# Patient Record
Sex: Male | Born: 1955
Health system: Southern US, Community
[De-identification: ages and names within clinical notes are randomized; demographics above are authoritative.]

## PROBLEM LIST (undated history)

## (undated) DIAGNOSIS — I10 Essential (primary) hypertension: Secondary | ICD-10-CM

## (undated) DIAGNOSIS — C4492 Squamous cell carcinoma of skin, unspecified: Secondary | ICD-10-CM

## (undated) HISTORY — DX: Squamous cell carcinoma of skin, unspecified: C44.92

---
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
## 2007-05-20 ENCOUNTER — Ambulatory Visit: Payer: Self-pay | Admitting: Sports Medicine

## 2007-05-20 DIAGNOSIS — G5750 Tarsal tunnel syndrome, unspecified lower limb: Secondary | ICD-10-CM | POA: Insufficient documentation

## 2011-07-18 ENCOUNTER — Other Ambulatory Visit: Payer: Self-pay | Admitting: Neurosurgery

## 2011-07-18 ENCOUNTER — Ambulatory Visit
Admission: RE | Admit: 2011-07-18 | Discharge: 2011-07-18 | Disposition: A | Discharge status: Home / Self Care | Payer: BC Managed Care – PPO | Source: Ambulatory Visit | Attending: Neurosurgery | Admitting: Neurosurgery

## 2011-07-18 DIAGNOSIS — M545 Low back pain, unspecified: Secondary | ICD-10-CM

## 2012-04-04 DIAGNOSIS — L719 Rosacea, unspecified: Secondary | ICD-10-CM | POA: Insufficient documentation

## 2012-04-04 DIAGNOSIS — D485 Neoplasm of uncertain behavior of skin: Secondary | ICD-10-CM | POA: Insufficient documentation

## 2012-04-04 DIAGNOSIS — L570 Actinic keratosis: Secondary | ICD-10-CM | POA: Insufficient documentation

## 2012-04-04 DIAGNOSIS — B079 Viral wart, unspecified: Secondary | ICD-10-CM | POA: Insufficient documentation

## 2012-11-10 DIAGNOSIS — L578 Other skin changes due to chronic exposure to nonionizing radiation: Secondary | ICD-10-CM | POA: Insufficient documentation

## 2014-01-21 ENCOUNTER — Ambulatory Visit (INDEPENDENT_AMBULATORY_CARE_PROVIDER_SITE_OTHER): Payer: BC Managed Care – PPO | Admitting: Podiatry

## 2014-01-21 ENCOUNTER — Ambulatory Visit (INDEPENDENT_AMBULATORY_CARE_PROVIDER_SITE_OTHER): Payer: BC Managed Care – PPO

## 2014-01-21 ENCOUNTER — Encounter: Payer: Self-pay | Admitting: Podiatry

## 2014-01-21 VITALS — BP 143/96 | HR 62 | Resp 16

## 2014-01-21 DIAGNOSIS — M79673 Pain in unspecified foot: Secondary | ICD-10-CM

## 2014-01-21 DIAGNOSIS — M79609 Pain in unspecified limb: Secondary | ICD-10-CM
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
## 2014-07-07 ENCOUNTER — Emergency Department (HOSPITAL_COMMUNITY)
Admission: EM | Admit: 2014-07-07 | Discharge: 2014-07-07 | Disposition: A | Discharge status: Home / Self Care | Payer: BC Managed Care – PPO | Attending: Emergency Medicine | Admitting: Emergency Medicine

## 2014-07-07 ENCOUNTER — Emergency Department (HOSPITAL_COMMUNITY): Payer: BC Managed Care – PPO

## 2014-07-07 ENCOUNTER — Encounter (HOSPITAL_COMMUNITY): Payer: Self-pay | Admitting: Emergency Medicine

## 2014-07-07 DIAGNOSIS — I10 Essential (primary) hypertension: Secondary | ICD-10-CM | POA: Insufficient documentation

## 2014-07-07 DIAGNOSIS — R200 Anesthesia of skin: Secondary | ICD-10-CM | POA: Diagnosis present

## 2014-07-07 DIAGNOSIS — R0789 Other chest pain: Secondary | ICD-10-CM

## 2014-07-07 HISTORY — DX: Essential (primary) hypertension: I10

## 2014-07-07 LAB — BASIC METABOLIC PANEL
ANION GAP: 13 (ref 5–15)
BUN: 18 mg/dL (ref 6–23)
CHLORIDE: 106 meq/L (ref 96–112)
CO2: 23 mEq/L (ref 19–32)
Calcium: 9 mg/dL (ref 8.4–10.5)
Creatinine, Ser: 0.98 mg/dL (ref 0.50–1.35)
GFR calc Af Amer: >90 mL/min (ref >90)
GFR calc non Af Amer: 90 mL/min — ABNORMAL LOW (ref >90)
GLUCOSE: 101 mg/dL — AB (ref 70–99)
POTASSIUM: 3.9 meq/L (ref 3.7–5.3)
Sodium: 142 mEq/L (ref 137–147)

## 2014-07-07 LAB — CBC
HCT: 44.9 % (ref 39.0–52.0)
HEMOGLOBIN: 16 g/dL (ref 13.0–17.0)
MCH: 31.3 pg (ref 26.0–34.0)
MCHC: 35.6 g/dL (ref 30.0–36.0)
MCV: 87.9 fL (ref 78.0–100.0)
Platelets: 168 10*3/uL (ref 150–400)
RBC: 5.11 MIL/uL (ref 4.22–5.81)
RDW: 12.6 % (ref 11.5–15.5)
WBC: 5.3 10*3/uL (ref 4.0–10.5)

## 2014-07-07 LAB — I-STAT TROPONIN, ED: Troponin i, poc: 0 ng/mL (ref 0.00–0.08)

## 2014-07-07 LAB — TROPONIN I: Troponin I: <0.3 ng/mL

## 2014-07-07 MED ORDER — ASPIRIN 81 MG PO CHEW
324.0000 mg | CHEWABLE_TABLET | Freq: Once | ORAL | Status: AC
  Administered 2014-07-07: 324 mg via ORAL
  Filled 2014-07-07: qty 4

## 2014-07-07 NOTE — ED Notes (Addendum)
Pt states he stopped taking his antihypertensive medication 3 months ago because it gave him a discomfort in the middle of his arm.

## 2014-07-07 NOTE — ED Notes (Addendum)
Pt. reports left chest discomfort with left hand numbness onset this evening , no SOB , denies nausea or diaphoresis . Pt. Has not taken his antihypertensive medication for several months.

## 2014-07-07 NOTE — ED Provider Notes (Signed)
CSN: [id number]     Arrival date & time 07/07/14  0355 History   First MD Initiated Contact with Patient 07/07/14 0526     Chief Complaint  Patient presents with  . Numbness     (Consider location/radiation/quality/duration/timing/severity/associated sxs/prior Treatment) Patient is a 58 y.o. male presenting with chest pain.  Chest Pain Pain location:  L chest Pain quality: aching   Radiates to: left arm. Pain severity:  Mild (1.5/10 at worst) Onset quality:  Gradual Duration:  12 hours Timing:  Constant Progression:  Resolved Chronicity:  New Context comment:  Spontaneoys Relieved by:  Nothing Worsened by:  Nothing tried Associated symptoms: no dizziness, no nausea, no shortness of breath and not vomiting   Associated symptoms comment:  Tingling left hand   Past Medical History  Diagnosis Date  . Hypertension    History reviewed. No pertinent past surgical history. No family history on file. History  Substance Use Topics  . Smoking status: Never Smoker   . Smokeless tobacco: Not on file  . Alcohol Use: Yes    Review of Systems  Respiratory: Negative for shortness of breath.   Cardiovascular: Positive for chest pain.  Gastrointestinal: Negative for nausea and vomiting.  Neurological: Negative for dizziness.  All other systems reviewed and are negative.     Allergies  Review of patient's allergies indicates no known allergies.  Home Medications   Prior to Admission medications   Not on File   BP 160/112 mmHg  Pulse 58  Temp(Src) 97.5 F (36.4 C) (Oral)  Resp 21  Ht 6' (1.829 m)  Wt 225 lb (102.059 kg)  BMI 30.51 kg/m2  SpO2 98% Physical Exam  Constitutional: He is oriented to person, place, and time. He appears well-developed and well-nourished. No distress.  HENT:  Head: Normocephalic and atraumatic.  Mouth/Throat: Oropharynx is clear and moist.  Eyes: Conjunctivae are normal. Pupils are equal, round, and reactive to light. No scleral icterus.   Neck: Neck supple.  Cardiovascular: Normal rate, regular rhythm, normal heart sounds and intact distal pulses.   No murmur heard. Pulmonary/Chest: Effort normal and breath sounds normal. No stridor. No respiratory distress. He has no wheezes. He has no rales.  Abdominal: Soft. He exhibits no distension. There is no tenderness.  Musculoskeletal: Normal range of motion. He exhibits no edema.  Neurological: He is alert and oriented to person, place, and time. He has normal strength. No cranial nerve deficit or sensory deficit. Coordination normal.  Skin: Skin is warm and dry. No rash noted.  Psychiatric: He has a normal mood and affect. His behavior is normal.  Nursing note and vitals reviewed.   ED Course  Procedures (including critical care time) Labs Review Labs Reviewed  BASIC METABOLIC PANEL - Abnormal; Notable for the following:    Glucose, Bld 101 (*)    GFR calc non Af Amer 90 (*)    All other components within normal limits  CBC  TROPONIN I  Randolm Idol, ED    Imaging Review Dg Chest 2 View  07/07/2014   CLINICAL DATA:  LEFT-sided chest discomfort for 1-1/2 disease.  EXAM: CHEST  2 VIEW  COMPARISON:  None.  FINDINGS: Cardiomediastinal silhouette is unremarkable. The lungs are clear without pleural effusions or focal consolidations. Trachea projects midline and there is no pneumothorax. Soft tissue planes and included osseous structures are non-suspicious.  IMPRESSION: No active cardiopulmonary disease.   Electronically Signed   By: Elon Alas   On: 07/07/2014 04:32  All radiology  studies independently viewed by me.      EKG Interpretation   Date/Time:  Wednesday July 07 2014 04:04:48 EST Ventricular Rate:  67 PR Interval:  138 QRS Duration: 96 QT Interval:  408 QTC Calculation: 431 R Axis:   77 Text Interpretation:  Normal sinus rhythm Normal ECG No old tracing to  compare Confirmed by [facility]  MD, TREY ([id number]) on 07/07/2014 5:26:24 AM      MDM    Final diagnoses:  Chest discomfort    58 year old male with history of hypertension presenting with atypical chest pain. Well-appearing and pain-free on exam.  His blood pressure is elevated, but likely because he stopped taking his BP meds.  He has them with him and will begin taking them again.  By his history, I doubt his chest pain is ACS, dissection, or PE.  Delta troponin negative.  I think he can be discharged and follow up outpatient.  Urged to take his BP meds.      Artis Delay, MD 07/07/14 [phone]

## 2014-07-07 NOTE — ED Notes (Signed)
Pt. Self administered home dose of atenolol-Chlorthal 100-25 mg @ 0715 per Dr. Doy Mince order.

## 2014-07-07 NOTE — Discharge Instructions (Signed)

## 2014-07-26 ENCOUNTER — Encounter (HOSPITAL_COMMUNITY): Payer: BC Managed Care – PPO

## 2014-10-21 DIAGNOSIS — D1801 Hemangioma of skin and subcutaneous tissue: Secondary | ICD-10-CM | POA: Insufficient documentation

## 2014-10-21 DIAGNOSIS — B351 Tinea unguium: Secondary | ICD-10-CM | POA: Insufficient documentation

## 2014-12-08 ENCOUNTER — Telehealth (HOSPITAL_COMMUNITY): Payer: Self-pay | Admitting: *Deleted

## 2014-12-08 ENCOUNTER — Encounter (HOSPITAL_COMMUNITY): Payer: Self-pay

## 2014-12-08 NOTE — Telephone Encounter (Signed)
Close encounter 

## 2014-12-09 ENCOUNTER — Ambulatory Visit (HOSPITAL_COMMUNITY): Payer: BLUE CROSS/BLUE SHIELD | Attending: Internal Medicine | Admitting: Radiology

## 2014-12-09 VITALS — BP 125/91 | Ht 71.5 in | Wt 228.0 lb

## 2014-12-09 DIAGNOSIS — I10 Essential (primary) hypertension: Secondary | ICD-10-CM | POA: Diagnosis not present

## 2014-12-09 DIAGNOSIS — R079 Chest pain, unspecified: Secondary | ICD-10-CM

## 2014-12-09 MED ORDER — TECHNETIUM TC 99M SESTAMIBI GENERIC - CARDIOLITE
11.0000 | Freq: Once | INTRAVENOUS | Status: AC | PRN
  Administered 2014-12-09: 11 via INTRAVENOUS

## 2014-12-09 MED ORDER — TECHNETIUM TC 99M SESTAMIBI GENERIC - CARDIOLITE
33.0000 | Freq: Once | INTRAVENOUS | Status: AC | PRN
  Administered 2014-12-09: 33 via INTRAVENOUS

## 2014-12-09 NOTE — Progress Notes (Signed)
[facility] [place], [place] [address] [phone]    Cardiology Nuclear Med Study  Brian Sherman is a 59 y.o. male     MRN : [id number]     DOB: September 14, 1955  Procedure Date: 12/09/2014  Nuclear Med Background Indication for Stress Test:  Evaluation for Ischemia History:  MPI: 10 yrs ago Cardiac Risk Factors: Hypertension  Symptoms:  Chest Pain   Nuclear Pre-Procedure Caffeine/Decaff Intake:  None> 12 hrs NPO After: 9:00pm   Lungs:  clear O2 Sat: 97% on room air. IV 0.9% NS with Angio Cath:  22g  IV Site: R Antecubital x 1, tolerated well IV Started by:  Irven Baltimore, RN  Chest Size (in):  42 Cup Size: n/a  Height: 5' 11.5" (1.816 m)  Weight:  228 lb (103.42 kg)  BMI:  Body mass index is 31.36 kg/(m^2). Tech Comments:  Patient held Atenolol-chlorthalidome x 24 hrs. Irven Baltimore, RN    Nuclear Med Study 1 or 2 day study: 1 day  Stress Test Type:  Stress  Reading MD: N/A  Order Authorizing Provider:  Gaynelle Arabian, MD  Resting Radionuclide: Technetium 78m Sestamibi  Resting Radionuclide Dose: 11.0 mCi   Stress Radionuclide:  Technetium 82m Sestamibi  Stress Radionuclide Dose: 33.0 mCi           Stress Protocol Rest HR: 48 Stress HR: 151  Rest BP: 125/91 Stress BP: 212/89  Exercise Time (min): 10:15 METS: 12.10   Predicted Max HR: 163 bpm % Max HR: 92.64 bpm Rate Pressure Product: 32012   Dose of Adenosine (mg):  n/a Dose of Lexiscan: n/a mg  Dose of Atropine (mg): n/a Dose of Dobutamine: n/a mcg/kg/min (at max HR)  Stress Test Technologist: Perrin Maltese, EMT-P  Nuclear Technologist:  Earl Many, CNMT     Rest Procedure:  Myocardial perfusion imaging was performed at rest 45 minutes following the intravenous administration of Technetium 37m Sestamibi. Rest ECG: Sinus bradycardia  Stress Procedure:  The patient exercised on the treadmill utilizing the Bruce Protocol for 10:15 minutes. The patient stopped due to  doe, fatigue and denied any chest pain.  Technetium 88m Sestamibi was injected at peak exercise and myocardial perfusion imaging was performed after a brief delay. Stress ECG: There was 30mm of upsloping ST segment depression at peak exercise.    QPS Raw Data Images:  Mild diaphragmatic attenuation.  Normal left ventricular size. Stress Images:  Normal homogeneous uptake in all areas of the myocardium. Rest Images:  Normal homogeneous uptake in all areas of the myocardium. Subtraction (SDS):  No evidence of ischemia. Transient Ischemic Dilatation (Normal <1.22):  0.84 Lung/Heart Ratio (Normal <0.45):  0.38  Quantitative Gated Spect Images QGS EDV:  106 ml QGS ESV:  46 ml  Impression Exercise Capacity:  Excellent exercise capacity. BP Response:  Hypertensive blood pressure response. Clinical Symptoms:  There is dyspnea. ECG Impression:  Insignificant upsloping ST segment depression. Comparison with Prior Nuclear Study: No images to compare  Overall Impression:  Normal stress nuclear study.  LV Ejection Fraction: 57%.  LV Wall Motion:  NL LV Function; NL Wall Motion   Signed: Fransico Him, MD [facility] 12/09/2014

## 2015-01-18 ENCOUNTER — Other Ambulatory Visit: Payer: Self-pay | Admitting: Surgery

## 2016-04-19 DIAGNOSIS — D485 Neoplasm of uncertain behavior of skin: Secondary | ICD-10-CM | POA: Diagnosis not present

## 2016-04-19 DIAGNOSIS — D229 Melanocytic nevi, unspecified: Secondary | ICD-10-CM | POA: Diagnosis not present

## 2016-04-19 DIAGNOSIS — L821 Other seborrheic keratosis: Secondary | ICD-10-CM | POA: Diagnosis not present

## 2016-04-19 DIAGNOSIS — Z808 Family history of malignant neoplasm of other organs or systems: Secondary | ICD-10-CM | POA: Diagnosis not present

## 2016-04-19 DIAGNOSIS — C44519 Basal cell carcinoma of skin of other part of trunk: Secondary | ICD-10-CM | POA: Diagnosis not present

## 2016-04-19 DIAGNOSIS — L570 Actinic keratosis: Secondary | ICD-10-CM | POA: Diagnosis not present

## 2016-06-01 IMAGING — NM NM MYOCAR MULTI W/ SPECT
3 series · 18 of 18 positions shown · non-contrast
Comparison: none

[Series 1: stress_(id)_sa · 6.5mm · 6.51mm/px · 6 of 64 frames shown (1 of 2)]
[frame 6/64]
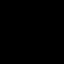
[frame 16/64]
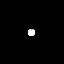
[frame 27/64]
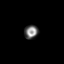
[frame 38/64]
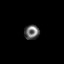
[frame 48/64]
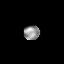
[frame 59/64]
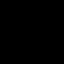

[Series 1: stress_(id)_sa · 6.5mm · 6.51mm/px · 6 of 512 frames shown (2 of 2)]
[frame 43/512]
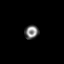
[frame 128/512]
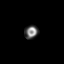
[frame 214/512]
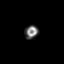
[frame 299/512]
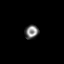
[frame 384/512]
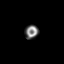
[frame 470/512]
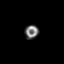

[Series 1: rest_(id)_sa · 6.5mm · 6.51mm/px · 6 of 64 frames shown]
[frame 6/64]
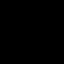
[frame 16/64]
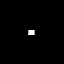
[frame 27/64]
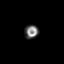
[frame 38/64]
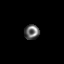
[frame 48/64]
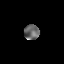
[frame 59/64]
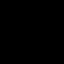

[18 of 18 positions shown; findings below may reference images not displayed]

Canned report from images found in remote index.

Refer to host system for actual result text.

## 2016-06-08 DIAGNOSIS — C44519 Basal cell carcinoma of skin of other part of trunk: Secondary | ICD-10-CM | POA: Diagnosis not present

## 2016-07-15 DIAGNOSIS — Z23 Encounter for immunization: Secondary | ICD-10-CM | POA: Diagnosis not present

## 2016-10-05 DIAGNOSIS — Z125 Encounter for screening for malignant neoplasm of prostate: Secondary | ICD-10-CM | POA: Diagnosis not present

## 2016-10-05 DIAGNOSIS — Z0000 Encounter for general adult medical examination without abnormal findings: Secondary | ICD-10-CM | POA: Diagnosis not present

## 2016-10-05 DIAGNOSIS — I10 Essential (primary) hypertension: Secondary | ICD-10-CM | POA: Diagnosis not present

## 2016-11-05 DIAGNOSIS — Z85828 Personal history of other malignant neoplasm of skin: Secondary | ICD-10-CM | POA: Diagnosis not present

## 2016-11-05 DIAGNOSIS — L814 Other melanin hyperpigmentation: Secondary | ICD-10-CM | POA: Diagnosis not present

## 2016-11-05 DIAGNOSIS — L821 Other seborrheic keratosis: Secondary | ICD-10-CM | POA: Diagnosis not present

## 2016-11-05 DIAGNOSIS — I781 Nevus, non-neoplastic: Secondary | ICD-10-CM | POA: Diagnosis not present

## 2016-11-05 DIAGNOSIS — L570 Actinic keratosis: Secondary | ICD-10-CM | POA: Diagnosis not present

## 2017-03-12 DIAGNOSIS — H40013 Open angle with borderline findings, low risk, bilateral: Secondary | ICD-10-CM | POA: Diagnosis not present

## 2017-03-12 DIAGNOSIS — H52203 Unspecified astigmatism, bilateral: Secondary | ICD-10-CM | POA: Diagnosis not present

## 2017-03-12 DIAGNOSIS — H524 Presbyopia: Secondary | ICD-10-CM | POA: Diagnosis not present

## 2017-04-02 DIAGNOSIS — T1512XA Foreign body in conjunctival sac, left eye, initial encounter: Secondary | ICD-10-CM | POA: Diagnosis not present

## 2017-05-15 DIAGNOSIS — L821 Other seborrheic keratosis: Secondary | ICD-10-CM | POA: Diagnosis not present

## 2017-05-15 DIAGNOSIS — L570 Actinic keratosis: Secondary | ICD-10-CM | POA: Diagnosis not present

## 2017-05-15 DIAGNOSIS — Z85828 Personal history of other malignant neoplasm of skin: Secondary | ICD-10-CM | POA: Diagnosis not present

## 2017-05-15 DIAGNOSIS — D1801 Hemangioma of skin and subcutaneous tissue: Secondary | ICD-10-CM | POA: Diagnosis not present

## 2017-05-15 DIAGNOSIS — L814 Other melanin hyperpigmentation: Secondary | ICD-10-CM | POA: Diagnosis not present

## 2017-05-15 DIAGNOSIS — L820 Inflamed seborrheic keratosis: Secondary | ICD-10-CM | POA: Diagnosis not present

## 2017-09-06 ENCOUNTER — Other Ambulatory Visit: Payer: Self-pay | Admitting: *Deleted

## 2017-09-06 DIAGNOSIS — M109 Gout, unspecified: Secondary | ICD-10-CM

## 2017-09-06 MED ORDER — CEPHALEXIN 500 MG PO CAPS: 500.0000 mg | ORAL_CAPSULE | Freq: Three times a day (TID) | ORAL | 0 refills | Status: DC

## 2017-10-09 DIAGNOSIS — Z0000 Encounter for general adult medical examination without abnormal findings: Secondary | ICD-10-CM | POA: Diagnosis not present

## 2017-10-09 DIAGNOSIS — I10 Essential (primary) hypertension: Secondary | ICD-10-CM | POA: Diagnosis not present

## 2018-04-07 DIAGNOSIS — M25562 Pain in left knee: Secondary | ICD-10-CM | POA: Diagnosis not present

## 2018-06-10 DIAGNOSIS — L570 Actinic keratosis: Secondary | ICD-10-CM | POA: Diagnosis not present

## 2018-06-10 DIAGNOSIS — L821 Other seborrheic keratosis: Secondary | ICD-10-CM | POA: Diagnosis not present

## 2018-06-10 DIAGNOSIS — L578 Other skin changes due to chronic exposure to nonionizing radiation: Secondary | ICD-10-CM | POA: Diagnosis not present

## 2018-06-10 DIAGNOSIS — Z85828 Personal history of other malignant neoplasm of skin: Secondary | ICD-10-CM | POA: Diagnosis not present

## 2018-06-10 DIAGNOSIS — D1801 Hemangioma of skin and subcutaneous tissue: Secondary | ICD-10-CM | POA: Diagnosis not present

## 2018-09-01 ENCOUNTER — Other Ambulatory Visit: Payer: Self-pay

## 2018-09-01 ENCOUNTER — Other Ambulatory Visit: Payer: Self-pay | Admitting: Podiatry

## 2018-09-01 DIAGNOSIS — M109 Gout, unspecified: Secondary | ICD-10-CM | POA: Diagnosis not present

## 2018-09-01 MED ORDER — PREDNISONE 10 MG (48) PO TBPK: ORAL_TABLET | Freq: Every day | ORAL | 0 refills | Status: DC

## 2018-09-01 NOTE — Addendum Note (Signed)
Addended by: Celene Skeen A on: 09/01/2018 08:50 AM   Modules accepted: Orders

## 2018-09-03 LAB — ANTI-NUCLEAR AB-TITER (ANA TITER)

## 2018-09-03 LAB — URIC ACID: URIC ACID, SERUM: 7.1 mg/dL (ref 4.0–8.0)

## 2018-09-03 LAB — HLA-B27 ANTIGEN: HLA-B27 Antigen: POSITIVE — AB

## 2018-09-03 LAB — C-REACTIVE PROTEIN: CRP: 6.1 mg/L (ref <8.0)

## 2018-09-03 LAB — ANA: Anti Nuclear Antibody (ANA): POSITIVE — AB

## 2018-09-03 LAB — SEDIMENTATION RATE: Sed Rate: 6 mm/h (ref 0–20)

## 2018-09-03 LAB — RHEUMATOID FACTOR: Rhuematoid fact SerPl-aCnc: <14 IU/mL (ref <14)

## 2018-09-05 ENCOUNTER — Encounter: Payer: Self-pay | Admitting: Podiatry

## 2019-03-16 DIAGNOSIS — H40023 Open angle with borderline findings, high risk, bilateral: Secondary | ICD-10-CM | POA: Diagnosis not present

## 2019-03-16 DIAGNOSIS — H2513 Age-related nuclear cataract, bilateral: Secondary | ICD-10-CM | POA: Diagnosis not present

## 2019-03-16 DIAGNOSIS — H52203 Unspecified astigmatism, bilateral: Secondary | ICD-10-CM | POA: Diagnosis not present

## 2019-03-27 DIAGNOSIS — H1031 Unspecified acute conjunctivitis, right eye: Secondary | ICD-10-CM | POA: Diagnosis not present

## 2019-04-03 DIAGNOSIS — H1031 Unspecified acute conjunctivitis, right eye: Secondary | ICD-10-CM | POA: Diagnosis not present

## 2019-06-01 ENCOUNTER — Other Ambulatory Visit: Payer: Self-pay

## 2019-06-01 DIAGNOSIS — Z20822 Contact with and (suspected) exposure to covid-19: Secondary | ICD-10-CM

## 2019-06-03 LAB — NOVEL CORONAVIRUS, NAA: SARS-CoV-2, NAA: NOT DETECTED

## 2019-07-20 DIAGNOSIS — D1039 Benign neoplasm of other parts of mouth: Secondary | ICD-10-CM | POA: Diagnosis not present

## 2019-08-14 HISTORY — PX: CATARACT EXTRACTION: SUR2

## 2019-08-17 DIAGNOSIS — D1039 Benign neoplasm of other parts of mouth: Secondary | ICD-10-CM | POA: Diagnosis not present

## 2019-08-21 ENCOUNTER — Ambulatory Visit: Payer: BLUE CROSS/BLUE SHIELD | Attending: Internal Medicine

## 2019-08-21 DIAGNOSIS — Z20822 Contact with and (suspected) exposure to covid-19: Secondary | ICD-10-CM

## 2019-08-23 LAB — NOVEL CORONAVIRUS, NAA: SARS-CoV-2, NAA: NOT DETECTED

## 2019-09-03 DIAGNOSIS — L821 Other seborrheic keratosis: Secondary | ICD-10-CM | POA: Diagnosis not present

## 2019-09-03 DIAGNOSIS — L812 Freckles: Secondary | ICD-10-CM | POA: Diagnosis not present

## 2019-09-03 DIAGNOSIS — D1801 Hemangioma of skin and subcutaneous tissue: Secondary | ICD-10-CM | POA: Diagnosis not present

## 2019-09-03 DIAGNOSIS — D045 Carcinoma in situ of skin of trunk: Secondary | ICD-10-CM | POA: Diagnosis not present

## 2019-09-03 DIAGNOSIS — L570 Actinic keratosis: Secondary | ICD-10-CM | POA: Diagnosis not present

## 2019-09-03 DIAGNOSIS — Z85828 Personal history of other malignant neoplasm of skin: Secondary | ICD-10-CM | POA: Diagnosis not present

## 2019-09-16 DIAGNOSIS — H40013 Open angle with borderline findings, low risk, bilateral: Secondary | ICD-10-CM | POA: Diagnosis not present

## 2019-09-16 DIAGNOSIS — H2513 Age-related nuclear cataract, bilateral: Secondary | ICD-10-CM | POA: Diagnosis not present

## 2019-09-24 DIAGNOSIS — H2512 Age-related nuclear cataract, left eye: Secondary | ICD-10-CM | POA: Diagnosis not present

## 2019-09-24 DIAGNOSIS — H25812 Combined forms of age-related cataract, left eye: Secondary | ICD-10-CM | POA: Diagnosis not present

## 2019-09-25 ENCOUNTER — Ambulatory Visit: Payer: BLUE CROSS/BLUE SHIELD | Attending: Internal Medicine

## 2019-09-25 DIAGNOSIS — Z20822 Contact with and (suspected) exposure to covid-19: Secondary | ICD-10-CM | POA: Diagnosis not present

## 2019-09-27 LAB — NOVEL CORONAVIRUS, NAA: SARS-CoV-2, NAA: NOT DETECTED

## 2019-10-03 DIAGNOSIS — Z20828 Contact with and (suspected) exposure to other viral communicable diseases: Secondary | ICD-10-CM | POA: Diagnosis not present

## 2019-10-05 ENCOUNTER — Ambulatory Visit: Payer: BLUE CROSS/BLUE SHIELD

## 2019-10-09 ENCOUNTER — Emergency Department (HOSPITAL_COMMUNITY): Payer: BLUE CROSS/BLUE SHIELD

## 2019-10-09 ENCOUNTER — Encounter (HOSPITAL_COMMUNITY): Payer: Self-pay | Admitting: Emergency Medicine

## 2019-10-09 ENCOUNTER — Emergency Department (HOSPITAL_COMMUNITY)
Admission: EM | Admit: 2019-10-09 | Discharge: 2019-10-09 | Disposition: A | Discharge status: Home / Self Care | Payer: BLUE CROSS/BLUE SHIELD | Attending: Emergency Medicine | Admitting: Emergency Medicine

## 2019-10-09 ENCOUNTER — Other Ambulatory Visit: Payer: Self-pay

## 2019-10-09 DIAGNOSIS — R001 Bradycardia, unspecified: Secondary | ICD-10-CM | POA: Diagnosis not present

## 2019-10-09 DIAGNOSIS — R519 Headache, unspecified: Secondary | ICD-10-CM | POA: Diagnosis not present

## 2019-10-09 DIAGNOSIS — R202 Paresthesia of skin: Secondary | ICD-10-CM | POA: Diagnosis not present

## 2019-10-09 DIAGNOSIS — Z7982 Long term (current) use of aspirin: Secondary | ICD-10-CM | POA: Diagnosis not present

## 2019-10-09 DIAGNOSIS — R200 Anesthesia of skin: Secondary | ICD-10-CM | POA: Insufficient documentation

## 2019-10-09 DIAGNOSIS — I10 Essential (primary) hypertension: Secondary | ICD-10-CM | POA: Insufficient documentation

## 2019-10-09 DIAGNOSIS — G459 Transient cerebral ischemic attack, unspecified: Secondary | ICD-10-CM | POA: Diagnosis not present

## 2019-10-09 LAB — CBC
HCT: 44.6 % (ref 39.0–52.0)
Hemoglobin: 15.1 g/dL (ref 13.0–17.0)
MCH: 30.5 pg (ref 26.0–34.0)
MCHC: 33.9 g/dL (ref 30.0–36.0)
MCV: 90.1 fL (ref 80.0–100.0)
Platelets: 178 10*3/uL (ref 150–400)
RBC: 4.95 MIL/uL (ref 4.22–5.81)
RDW: 12 % (ref 11.5–15.5)
WBC: 5.5 10*3/uL (ref 4.0–10.5)
nRBC: 0 % (ref 0.0–0.2)

## 2019-10-09 LAB — I-STAT CHEM 8, ED
BUN: 22 mg/dL (ref 8–23)
Calcium, Ion: 1.13 mmol/L — ABNORMAL LOW (ref 1.15–1.40)
Chloride: 102 mmol/L (ref 98–111)
Creatinine, Ser: 0.9 mg/dL (ref 0.61–1.24)
Glucose, Bld: 138 mg/dL — ABNORMAL HIGH (ref 70–99)
HCT: 43 % (ref 39.0–52.0)
Hemoglobin: 14.6 g/dL (ref 13.0–17.0)
Potassium: 3.1 mmol/L — ABNORMAL LOW (ref 3.5–5.1)
Sodium: 139 mmol/L (ref 135–145)
TCO2: 27 mmol/L (ref 22–32)

## 2019-10-09 LAB — COMPREHENSIVE METABOLIC PANEL
ALT: 27 U/L (ref 0–44)
AST: 30 U/L (ref 15–41)
Albumin: 3.9 g/dL (ref 3.5–5.0)
Alkaline Phosphatase: 45 U/L (ref 38–126)
Anion gap: 11 (ref 5–15)
BUN: 21 mg/dL (ref 8–23)
CO2: 25 mmol/L (ref 22–32)
Calcium: 8.9 mg/dL (ref 8.9–10.3)
Chloride: 103 mmol/L (ref 98–111)
Creatinine, Ser: 0.99 mg/dL (ref 0.61–1.24)
GFR calc Af Amer: >60 mL/min (ref >60)
GFR calc non Af Amer: >60 mL/min (ref >60)
Glucose, Bld: 147 mg/dL — ABNORMAL HIGH (ref 70–99)
Potassium: 3.2 mmol/L — ABNORMAL LOW (ref 3.5–5.1)
Sodium: 139 mmol/L (ref 135–145)
Total Bilirubin: 0.7 mg/dL (ref 0.3–1.2)
Total Protein: 6.4 g/dL — ABNORMAL LOW (ref 6.5–8.1)

## 2019-10-09 LAB — DIFFERENTIAL
Abs Immature Granulocytes: 0.02 10*3/uL (ref 0.00–0.07)
Basophils Absolute: 0 10*3/uL (ref 0.0–0.1)
Basophils Relative: 1 %
Eosinophils Absolute: 0.1 10*3/uL (ref 0.0–0.5)
Eosinophils Relative: 2 %
Immature Granulocytes: 0 %
Lymphocytes Relative: 23 %
Lymphs Abs: 1.3 10*3/uL (ref 0.7–4.0)
Monocytes Absolute: 0.3 10*3/uL (ref 0.1–1.0)
Monocytes Relative: 6 %
Neutro Abs: 3.7 10*3/uL (ref 1.7–7.7)
Neutrophils Relative %: 68 %

## 2019-10-09 LAB — APTT: aPTT: 32 seconds (ref 24–36)

## 2019-10-09 LAB — PROTIME-INR
INR: 1 (ref 0.8–1.2)
Prothrombin Time: 13.4 seconds (ref 11.4–15.2)

## 2019-10-09 MED ORDER — ASPIRIN 81 MG PO CHEW
81.0000 mg | CHEWABLE_TABLET | Freq: Once | ORAL | Status: AC
  Administered 2019-10-09: 81 mg via ORAL
  Filled 2019-10-09: qty 1

## 2019-10-09 MED ORDER — ASPIRIN 81 MG PO CHEW: 81.0000 mg | CHEWABLE_TABLET | Freq: Every day | ORAL | 1 refills | Status: AC

## 2019-10-09 MED ORDER — POTASSIUM CHLORIDE CRYS ER 20 MEQ PO TBCR
40.0000 meq | EXTENDED_RELEASE_TABLET | Freq: Once | ORAL | Status: AC
  Administered 2019-10-09: 40 meq via ORAL
  Filled 2019-10-09: qty 2

## 2019-10-09 MED ORDER — SODIUM CHLORIDE 0.9% FLUSH: 3.0000 mL | Freq: Once | INTRAVENOUS | Status: DC

## 2019-10-09 NOTE — ED Provider Notes (Signed)
[facility] EM[facility] DEPARTMENT Provider Note   CSN: [id number] Arrival date & time: 10/09/19  1230     History Chief Complaint  Patient presents with  . left arm numbness  . facial numbness    Brian Sherman is a 64 y.o. male w/ HTN presenting to ED with left hand numbness.  He reports onset of symptoms around 10 am.  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  He began having numbness/paresthesias in his left forearm from the elbow into his hand, including the entire hand.  He thought it was positional.  However when he walked out out of the workplace he felt the right half of his face going numb.  He felt his face was "flushed."  That sensation has resolved but his left hand feels persistently numb.  Denies headache, LOC, facial droop, difficulty speaking, imbalance, falls. Denies hx of TIA or stroke.  HPI     Past Medical History:  Diagnosis Date  . Hypertension     Patient Active Problem List   Diagnosis Date Noted  . TARSAL TUNNEL SYNDROME, LEFT 05/20/2007    Past Surgical History:  Procedure Laterality Date  . CATARACT EXTRACTION         No family history on file.  Social History   Tobacco Use  . Smoking status: Never Smoker  Substance Use Topics  . Alcohol use: Yes  . Drug use: No    Home Medications Prior to Admission medications   Medication Sig Start Date End Date Taking? Authorizing Provider  atenolol-chlorthalidone (TENORETIC) 100-25 MG per tablet Take 1 tablet by mouth daily.   Yes [provider]  naproxen sodium (ALEVE) 220 MG tablet Take 220-440 mg by mouth 2 (two) times daily as needed (for muscle soreness or pain).    Yes [provider]  prednisoLONE acetate (PRED FORTE) 1 % ophthalmic suspension Place 1 drop into the left eye See admin instructions. Place 1 drop into the left eye three times a day and taper down as directed 09/23/19  Yes [provider]  aspirin 81 MG chewable tablet Chew 1 tablet (81 mg total) by mouth daily. 10/09/19 10/03/20  Wyvonnia Dusky, MD  cephALEXin (KEFLEX) 500 MG capsule Take 1 capsule (500 mg total) by mouth 3 (three) times daily. Patient not taking: Reported on 10/09/2019 09/06/17   Wallene Huh, DPM  predniSONE (STERAPRED UNI-PAK 48 TAB) 10 MG (48) TBPK tablet Take by mouth daily. [place] Patient not taking: Reported on 10/09/2019 09/01/18   Wallene Huh, DPM    Allergies    Patient has no known allergies.  Review of Systems   Review of Systems  Constitutional: Negative for chills and fever.  HENT: Negative for ear pain.   Eyes: Negative for pain and visual disturbance.  Respiratory: Negative for cough and shortness of breath.   Cardiovascular: Negative for chest pain and palpitations.  Gastrointestinal: Negative for abdominal pain and vomiting.  Musculoskeletal: Negative for arthralgias and neck pain.  Neurological: Positive for facial asymmetry and numbness. Negative for dizziness, tremors, seizures, syncope, speech difficulty, weakness and headaches.  Psychiatric/Behavioral: Negative for agitation and confusion.  All other systems reviewed and are negative.   Physical Exam Updated Vital Signs BP 120/71 (BP Location: Right Arm)   Pulse (!) 51   Temp 98.2 F (36.8 C) (Oral)   Resp 18   Ht 6' (1.829 m)  Wt 95.3 kg   SpO2 96%   BMI 28.48 kg/m   Physical Exam Vitals and nursing note reviewed.  Constitutional:      Appearance: He is well-developed.  HENT:     Head: Normocephalic and atraumatic.  Eyes:     Conjunctiva/sclera: Conjunctivae normal.  Cardiovascular:     Rate and Rhythm: Normal rate and regular rhythm.     Heart sounds: No murmur.  Pulmonary:     Effort: Pulmonary effort is normal. No respiratory distress.     Breath sounds: Normal breath sounds.  Abdominal:     Palpations: Abdomen is soft.     Tenderness: There is no abdominal tenderness.    Musculoskeletal:     Cervical back: Neck supple.  Skin:    General: Skin is warm and dry.  Neurological:     Mental Status: He is alert.     GCS: GCS eye subscore is 4. GCS verbal subscore is 5. GCS motor subscore is 6.     Cranial Nerves: Cranial nerves are intact. No cranial nerve deficit, dysarthria or facial asymmetry.     Sensory: Sensation is intact. No sensory deficit.     Motor: Motor function is intact. No weakness, tremor or pronator drift.     Coordination: Coordination is intact. Finger-Nose-Finger Test normal.     Gait: Gait is intact.  Psychiatric:        Mood and Affect: Mood normal.        Behavior: Behavior normal.     ED Results / Procedures / Treatments   Labs (all labs ordered are listed, but only abnormal results are displayed) Labs Reviewed  COMPREHENSIVE METABOLIC PANEL - Abnormal; Notable for the following components:      Result Value   Potassium 3.2 (*)    Glucose, Bld 147 (*)    Total Protein 6.4 (*)    All other components within normal limits  I-STAT CHEM 8, ED - Abnormal; Notable for the following components:   Potassium 3.1 (*)    Glucose, Bld 138 (*)    Calcium, Ion 1.13 (*)    All other components within normal limits  PROTIME-INR  APTT  CBC  DIFFERENTIAL  CBG MONITORING, ED    EKG EKG Interpretation  Date/Time:  Friday October 09 2019 12:34:16 EST Ventricular Rate:  51 PR Interval:  148 QRS Duration: 96 QT Interval:  460 QTC Calculation: 423 R Axis:   76 Text Interpretation: Sinus bradycardia Otherwise normal ECG No STEMI Confirmed by Octaviano Glow [phone]) on 10/09/2019 5:18:37 PM   Radiology CT HEAD WO CONTRAST  Result Date: 10/09/2019 CLINICAL DATA:  Headache, left-sided numbness EXAM: CT HEAD WITHOUT CONTRAST TECHNIQUE: Contiguous axial images were obtained from the base of the skull through the vertex without intravenous contrast. COMPARISON:  None. FINDINGS: Brain: No evidence of acute infarction, hemorrhage,  hydrocephalus, extra-axial collection or mass lesion/mass effect. Vascular: No hyperdense vessel or unexpected calcification. Skull: Normal. Negative for fracture or focal lesion. Sinuses/Orbits: No acute finding. Other: None. IMPRESSION: No acute intracranial findings. Electronically Signed   By: Davina Poke D.O.   On: 10/09/2019 13:45   MR BRAIN WO CONTRAST  Result Date: 10/09/2019 CLINICAL DATA:  TIA, left arm numbness and right face numbness; transient ischemic attack. EXAM: MRI HEAD WITHOUT CONTRAST TECHNIQUE: Multiplanar, multiecho pulse sequences of the brain and surrounding structures were obtained without intravenous contrast. COMPARISON:  Noncontrast head CT 10/09/2019 FINDINGS: Brain: There is no evidence of acute infarct. No evidence of intracranial  mass. No midline shift or extra-axial fluid collection. No chronic intracranial blood products. No significant white matter disease for age. Mild generalized parenchymal atrophy. Partially empty sella turcica. Vascular: Flow voids maintained within the proximal large arterial vessels. Skull and upper cervical spine: No focal marrow lesion. Sinuses/Orbits: Visualized orbits demonstrate no acute abnormality. Minimal ethmoid sinus mucosal thickening. Moderate-sized right maxillary sinus mucous retention cyst. No significant mastoid effusion. IMPRESSION: No evidence of acute intracranial abnormality, including acute infarction. Mild generalized parenchymal atrophy. Mild ethmoid sinus mucosal thickening. Moderate-sized right maxillary sinus mucous retention cyst. Electronically Signed   By: Kellie Simmering DO   On: 10/09/2019 20:55    Procedures Procedures (including critical care time)  Medications Ordered in ED Medications  sodium chloride flush (NS) 0.9 % injection 3 mL (3 mLs Intravenous Not Given 10/09/19 1809)  aspirin chewable tablet 81 mg (81 mg Oral Given 10/09/19 1808)  potassium chloride SA (KLOR-CON) CR tablet 40 mEq (40 mEq Oral Given  10/09/19 1854)    ED Course  I have reviewed the triage vital signs and the nursing notes.  Pertinent labs & imaging results that were available during my care of the patient were reviewed by me and considered in my medical decision making (see chart for details).  64 yo male here with left arm paresthesia and right face tingling that occurred earlier today.  No objective motor deficits or weakness on exam.  No neurological deficits, including fine touch, preserved everywhere.  No facial asymmetry.  CTH negative.  I proceeded to MRI brain for TIA evaluation.  Negative for acute findings as well.  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  Low ABCD2 score.  After discussion with Dr Rory Percy of neurology, I'll start on aspirin 81 mg daily and have him f/u in the neuro clinic   Clinical Course as of Oct 09 10  Fri Oct 09, 2019  2115  IMPRESSION: No evidence of acute intracranial abnormality, including acute infarction.  Mild generalized parenchymal atrophy.  Mild ethmoid sinus mucosal thickening. Moderate-sized right maxillary sinus mucous retention cyst.   [MT]    Clinical Course User Index [MT] Luismiguel Lamere, Carola Rhine, MD    Final Clinical Impression(s) / ED Diagnoses Final diagnoses:  Left arm numbness    Rx / DC Orders ED Discharge Orders         Ordered    Ambulatory referral to Neurology    Comments: An appointment is requested in approximately: 1 week Consulted with Dr Rory Percy in the ED on 2/26, had TIA workup including MRI, advised to start on aspirin and follow up in office   10/09/19 2138    aspirin 81 MG chewable tablet  Daily     10/09/19 2140           Wyvonnia Dusky, MD 10/10/19 [phone]

## 2019-10-09 NOTE — ED Notes (Signed)
Pt transported to MRI 

## 2019-10-09 NOTE — ED Notes (Signed)
Provider aware of pts bradycardia. Pt is asymptomatic

## 2019-10-09 NOTE — Discharge Instructions (Signed)
Your MRI and CT scan did not show signs of stroke or mini-stroke (TIA).  Our neurologist still recommended that you start taking 81 mg aspirin daily and follow up in their office this week.  You may need further workup to rule out vascular disease in the future.  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  If you notice new numbness, weakness, sudden headache, difficulty speaking, sudden lightheadedness, chest pain, or difficulty breathing, come back to the ER immediately.

## 2019-10-09 NOTE — ED Notes (Signed)
Patient verbalizes understanding of discharge instructions. Opportunity for questioning and answers were provided. Armband removed by staff, pt discharged from ED.  

## 2019-10-09 NOTE — ED Triage Notes (Signed)
Left arm numbness started this am, hand still feels numb/tingly-- right side of face felt tingly-- gone now. All started 3 hours ago--

## 2019-10-12 LAB — CBG MONITORING, ED: Glucose-Capillary: 74 mg/dL (ref 70–99)

## 2019-10-15 DIAGNOSIS — H2511 Age-related nuclear cataract, right eye: Secondary | ICD-10-CM | POA: Diagnosis not present

## 2019-10-15 DIAGNOSIS — H25811 Combined forms of age-related cataract, right eye: Secondary | ICD-10-CM | POA: Diagnosis not present

## 2019-10-23 DIAGNOSIS — Z13220 Encounter for screening for lipoid disorders: Secondary | ICD-10-CM | POA: Diagnosis not present

## 2019-10-23 DIAGNOSIS — Z0000 Encounter for general adult medical examination without abnormal findings: Secondary | ICD-10-CM | POA: Diagnosis not present

## 2019-10-23 DIAGNOSIS — R7309 Other abnormal glucose: Secondary | ICD-10-CM | POA: Diagnosis not present

## 2019-10-23 DIAGNOSIS — I10 Essential (primary) hypertension: Secondary | ICD-10-CM | POA: Diagnosis not present

## 2019-10-23 DIAGNOSIS — Z23 Encounter for immunization: Secondary | ICD-10-CM | POA: Diagnosis not present

## 2019-11-01 DIAGNOSIS — Z20828 Contact with and (suspected) exposure to other viral communicable diseases: Secondary | ICD-10-CM | POA: Diagnosis not present

## 2019-11-09 ENCOUNTER — Inpatient Hospital Stay: Payer: Self-pay | Admitting: Adult Health

## 2020-02-29 DIAGNOSIS — L570 Actinic keratosis: Secondary | ICD-10-CM | POA: Diagnosis not present

## 2020-02-29 DIAGNOSIS — D0461 Carcinoma in situ of skin of right upper limb, including shoulder: Secondary | ICD-10-CM | POA: Diagnosis not present

## 2020-02-29 DIAGNOSIS — Z85828 Personal history of other malignant neoplasm of skin: Secondary | ICD-10-CM | POA: Diagnosis not present

## 2020-02-29 DIAGNOSIS — L814 Other melanin hyperpigmentation: Secondary | ICD-10-CM | POA: Diagnosis not present

## 2020-02-29 DIAGNOSIS — L821 Other seborrheic keratosis: Secondary | ICD-10-CM | POA: Diagnosis not present

## 2020-04-04 DIAGNOSIS — Z1211 Encounter for screening for malignant neoplasm of colon: Secondary | ICD-10-CM | POA: Diagnosis not present

## 2020-04-04 DIAGNOSIS — K640 First degree hemorrhoids: Secondary | ICD-10-CM | POA: Diagnosis not present

## 2020-06-08 DIAGNOSIS — Z20822 Contact with and (suspected) exposure to covid-19: Secondary | ICD-10-CM | POA: Diagnosis not present

## 2020-06-15 ENCOUNTER — Encounter: Payer: Self-pay | Admitting: Neurology

## 2020-06-15 ENCOUNTER — Telehealth: Payer: Self-pay | Admitting: Neurology

## 2020-06-15 ENCOUNTER — Other Ambulatory Visit: Payer: Self-pay

## 2020-06-15 ENCOUNTER — Other Ambulatory Visit: Payer: Self-pay | Admitting: Neurology

## 2020-06-15 ENCOUNTER — Ambulatory Visit (INDEPENDENT_AMBULATORY_CARE_PROVIDER_SITE_OTHER): Payer: BC Managed Care – PPO | Admitting: Neurology

## 2020-06-15 VITALS — BP 116/78 | HR 58 | Ht 72.0 in | Wt 238.0 lb

## 2020-06-15 DIAGNOSIS — R202 Paresthesia of skin: Secondary | ICD-10-CM | POA: Diagnosis not present

## 2020-06-15 DIAGNOSIS — R200 Anesthesia of skin: Secondary | ICD-10-CM | POA: Diagnosis not present

## 2020-06-15 MED ORDER — ATORVASTATIN CALCIUM 10 MG PO TABS: 10.0000 mg | ORAL_TABLET | Freq: Every day | ORAL | 4 refills | Status: AC

## 2020-06-15 NOTE — Telephone Encounter (Signed)
[employer] Josem Kaufmann: [phone] (exp. 06/15/20 to 12/11/20) order sent to GI. They will reach out to the patient to schedule.

## 2020-06-15 NOTE — Progress Notes (Signed)
[facility]    Provider:  Dr Jaynee Eagles Requesting Provider: Wyvonnia Dusky, MD  Primary Care Provider:  Gaynelle Arabian, MD  CC:  Left arm and facial weakness  HPI:  Brian Sherman is a 64 y.o. male here as requested by Wyvonnia Dusky, MD for left arm and facial weakness.  Past medical history hypertension.  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  However when he walked out of the workplace he felt the right half of his face going numb, he felt his face was flushed, that sensation has resolved but left hand still felt numb.  He denied headache facial droop difficulty speaking imbalance falls.  Never smoked.  Labs included normal glucose, unremarkable CMP, normal CBC.   He is here alone, his wife and him are applying for long-term care insurance. 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 He was doing very repetitive actions, his arm felt a little numb, he has some permanent numbness of the little fingers and it was worse than normal, he thinks it was from the repetitive motion, just left arm, his cheek was numb on the left side, it probably lasted for an hour, he called primary care, he was started on ASA 81mg . It was one of those big cameras on tripods. There was nothing on his face. He went to the ED. He is resolved. No other focal neurologic deficits, associated symptoms, inciting events or modifiable factors.  Reviewed notes, labs and imaging from outside physicians, which showed:  HgbA1c 5.6.  LDL 122  MRI brain: personally reviewed and agree with findings No evidence of acute intracranial abnormality, including  acute infarction.  Mild generalized parenchymal atrophy.  Review of Systems: Patient complains of symptoms per HPI as well as the following symptoms numbness resolved. Pertinent negatives and positives per HPI. All others negative.   Social History   Socioeconomic History  . Marital status: Married    Spouse name: Not on file  . Number of children: 2  . Years of education: Not on file  . Highest education level: Not on file  Occupational History  . Not on file  Tobacco Use  . Smoking status: Never Smoker  . Smokeless tobacco: Never Used  Substance and Sexual Activity  . Alcohol use: Yes    Alcohol/week: 2.0 standard drinks    Types: 2 Standard drinks or equivalent per week    Comment: 2+  . Drug use: No  . Sexual activity: Not on file  Other Topics Concern  . Not on file  Social History Narrative   Lives at home with wife   Right handed   Caffeine: 2 mugs per day, approx 24 ounces    Social Determinants of Health   Financial Resource Strain:   . Difficulty of Paying Living Expenses: Not on file  Food Insecurity:   . Worried About Charity fundraiser in the Last Year: Not on file  . Ran Out of Food in the Last Year: Not on file  Transportation Needs:   . Lack of Transportation (Medical): Not on file  . Lack of Transportation (Non-Medical):  Not on file  Physical Activity:   . Days of Exercise per Week: Not on file  . Minutes of Exercise per Session: Not on file  Stress:   . Feeling of Stress : Not on file  Social Connections:   . Frequency of Communication with Friends and Family: Not on file  . Frequency of Social Gatherings with Friends and Family: Not on file  . Attends Religious Services: Not on file  . Active Member of Clubs or Organizations: Not on file  . Attends Archivist Meetings: Not on file  . Marital Status: Not on file  Intimate Partner Violence:   . Fear of Current or Ex-Partner: Not on file  . Emotionally Abused: Not on file  .  Physically Abused: Not on file  . Sexually Abused: Not on file    Family History  Problem Relation Age of Onset  . Healthy Mother   . Asthma Father   . CAD Father   . Stroke Neg Hx     Past Medical History:  Diagnosis Date  . Hypertension   . Squamous cell skin cancer     Patient Active Problem List   Diagnosis Date Noted  . TARSAL TUNNEL SYNDROME, LEFT 05/20/2007    Past Surgical History:  Procedure Laterality Date  . CATARACT EXTRACTION Bilateral 2021  . INGUINAL HERNIA REPAIR Left 1974    Current Outpatient Medications  Medication Sig Dispense Refill  . aspirin 81 MG chewable tablet Chew 1 tablet (81 mg total) by mouth daily. 180 tablet 1  . atenolol-chlorthalidone (TENORETIC) 100-25 MG per tablet Take 1 tablet by mouth daily.    . naproxen sodium (ALEVE) 220 MG tablet Take 220-440 mg by mouth 2 (two) times daily as needed (for muscle soreness or pain).     Marland Kitchen atorvastatin (LIPITOR) 10 MG tablet Take 1 tablet (10 mg total) by mouth daily. 90 tablet 4   No current facility-administered medications for this visit.    Allergies as of 06/15/2020  . (No Known Allergies)    Vitals: BP 116/78 (BP Location: Right Arm, Patient Position: Sitting)   Pulse (!) 58   Ht 6' (1.829 m)   Wt 238 lb (108 kg)   SpO2 96%   BMI 32.28 kg/m  Last Weight:  Wt Readings from Last 1 Encounters:  06/15/20 238 lb (108 kg)   Last Height:   Ht Readings from Last 1 Encounters:  06/15/20 6' (1.829 m)     Physical exam: Exam: Gen: NAD, conversant, well nourised, obese, well groomed                     CV: RRR, no MRG. No Carotid Bruits. No peripheral edema, warm, nontender Eyes: Conjunctivae clear without exudates or hemorrhage  Neuro: Detailed Neurologic Exam  Speech:    Speech is normal; fluent and spontaneous with normal comprehension.  Cognition:    The patient is oriented to person, place, and time;     recent and remote memory intact;     language fluent;     normal  attention, concentration,     fund of knowledge Cranial Nerves:    The pupils are equal, round, and reactive to light. The fundi are flat. Visual fields are full to finger confrontation. Extraocular movements are intact. Trigeminal sensation is intact and the muscles of mastication are normal. The face is symmetric. The palate elevates in the midline. Hearing intact. Voice is normal. Shoulder shrug is normal. The tongue has  normal motion without fasciculations.   Coordination:    Normal finger to nose   Gait:  normal native gait  Motor Observation:    No asymmetry, no atrophy, and no involuntary movements noted. Tone:    Normal muscle tone.    Posture:    Posture is normal. normal erect    Strength:    Strength is V/V in the upper and lower limbs.      Sensation: intact to LT     Reflex Exam:  DTR's:    Deep tendon reflexes in the upper and lower extremities are normal bilaterally.   Toes:    The toes are downgoing bilaterally.   Clonus:    Clonus is absent.    Assessment/Plan:  64 year old with episode of left face and arm numbness, unclear etiology, could have been positional or compressive or possibly TIA.   CT of the blood vessels of the head and neck  Echocardiogram of the heart 2 week patch to monitor heart rate One blood test Start atorvastatin (goal LDL <70) Follow up with Dr. Marisue Humble Continue ASA 81mg  daily  Orders Placed This Encounter  Procedures  . CT ANGIO NECK W OR WO CONTRAST  . CT ANGIO HEAD W OR WO CONTRAST  . Basic Metabolic Panel  . CARDIAC EVENT MONITOR  . ECHOCARDIOGRAM COMPLETE BUBBLE STUDY   Meds ordered this encounter  Medications  . atorvastatin (LIPITOR) 10 MG tablet    Sig: Take 1 tablet (10 mg total) by mouth daily.    Dispense:  90 tablet    Refill:  4    Cc: Trifan, Carola Rhine, MD,  Gaynelle Arabian, MD  Sarina Ill, MD  [facility] [address] [place], [place] [address]  Phone  [phone] Fax [phone]

## 2020-06-15 NOTE — Patient Instructions (Addendum)
CT of the blood vessels of the head and neck  Echocardiogram of the heart 2 week patch to monitor heart rate One blood test Start atorvastatin (goal LDL <70) Follow up with Dr. Marisue Humble Continue ASA 81mg  daily   Transient Ischemic Attack  A transient ischemic attack (TIA) is a "warning stroke" that causes stroke-like symptoms that go away quickly. A TIA does not cause lasting damage to the brain. But having a TIA is a sign that you may be at risk for a stroke. Lifestyle changes and medical treatments can help prevent a stroke. It is important to know the symptoms of a TIA and what to do. Get help right away, even if your symptoms go away. The symptoms of a TIA are the same as those of a stroke. They can happen fast, and they usually go away within minutes or hours. They can include:  Weakness or loss of feeling in your face, arm, or leg. This often happens on one side of your body.  Trouble walking.  Trouble moving your arms or legs.  Trouble talking or understanding what people are saying.  Trouble seeing.  Seeing two of one object (double vision).  Feeling dizzy.  Feeling confused.  Loss of balance or coordination.  Feeling sick to your stomach (nauseous) and throwing up (vomiting).  A very bad headache for no reason. What increases the risk? Certain things may make you more likely to have a TIA. Some of these are things that you can change, such as:  Being very overweight (obese).  Using products that contain nicotine or tobacco, such as cigarettes and e-cigarettes.  Taking birth control pills.  Not being active.  Drinking too much alcohol.  Using drugs. Other risk factors include:  Having an irregular heartbeat (atrial fibrillation).  Being African American or Hispanic.  Having had blood clots, stroke, TIA, or heart attack in the past.  Being a woman with a history of high blood pressure in pregnancy (preeclampsia).  Being over the age of 2.  Being  male.  Having family history of stroke.  Having the following diseases or conditions: ? High blood pressure. ? High cholesterol. ? Diabetes. ? Heart disease. ? Sickle cell disease. ? Sleep apnea. ? Migraine headache. ? Long-term (chronic) diseases that cause soreness and swelling (inflammation). ? Disorders that affect how your blood clots. Follow these instructions at home: Medicines   Take over-the-counter and prescription medicines only as told by your doctor.  If you were told to take aspirin or another medicine to thin your blood, take it exactly as told by your doctor. ? Taking too much of the medicine can cause bleeding. ? Taking too little of the medicine may not work to treat the problem. Eating and drinking   Eat 5 or more servings of fruits and vegetables each day.  Follow instructions from your doctor about your diet. You may need to follow a certain diet to help lower your risk of having a stroke. You may need to: ? Eat a diet that is low in fat and salt. ? Eat foods that contain a lot of fiber. ? Limit the amount of carbohydrates and sugar in your diet.  Limit alcohol intake to 1 drink a day for nonpregnant women and 2 drinks a day for men. One drink equals 12 oz of beer, 5 oz of wine, or 1 oz of hard liquor. General instructions  Keep a healthy weight.  Stay active. Try to get at least 30 minutes of activity  on all or most days.  Find out if you have a condition called sleep apnea. Get treatment if needed.  Do not use any products that contain nicotine or tobacco, such as cigarettes and e-cigarettes. If you need help quitting, ask your doctor.  Do not abuse drugs.  Keep all follow-up visits as told by your doctor. This is important. Get help right away if:  You have any signs of stroke. "BE FAST" is an easy way to remember the main warning signs: ? B - Balance. Signs are dizziness, sudden trouble walking, or loss of balance. ? E - Eyes. Signs are  trouble seeing or a sudden change in how you see. ? F - Face. Signs are sudden weakness or loss of feeling of the face, or the face or eyelid drooping on one side. ? A - Arms. Signs are weakness or loss of feeling in an arm. This happens suddenly and usually on one side of the body. ? S - Speech. Signs are sudden trouble speaking, slurred speech, or trouble understanding what people say. ? T - Time. Time to call emergency services. Write down what time symptoms started.  You have other signs of stroke, such as: ? A sudden, very bad headache with no known cause. ? Feeling sick to your stomach (nausea). ? Throwing up (vomiting). ? Jerky movements that you cannot control (seizure). These symptoms may be an emergency. Do not wait to see if the symptoms will go away. Get medical help right away. Call your local emergency services (911 in the [place] Do not drive yourself to the hospital. Summary  A transient ischemic attack (TIA) is a "warning stroke" that causes stroke-like symptoms that go away quickly.  A TIA is a medical emergency. Get help right away, even if your symptoms go away.  A TIA does not cause lasting damage to the brain.  Having a TIA is a sign that you may be at risk for a stroke. Lifestyle changes and medical treatments can help prevent a stroke. This information is not intended to replace advice given to you by your health care provider. Make sure you discuss any questions you have with your health care provider. Document Revised: 04/25/2018 Document Reviewed: 10/31/2016 Elsevier Patient Education  Brian Sherman.   Atorvastatin tablets What is this medicine? ATORVASTATIN (a TORE va sta tin) is known as a HMG-CoA reductase inhibitor or 'statin'. It lowers the level of cholesterol and triglycerides in the blood. This drug may also reduce the risk of heart attack, stroke, or other health problems in patients with risk factors for heart disease. Diet and lifestyle changes  are often used with this drug. This medicine may be used for other purposes; ask your health care provider or pharmacist if you have questions. COMMON BRAND NAME(S): Lipitor What should I tell my health care provider before I take this medicine? They need to know if you have any of these conditions:  diabetes  if you often drink alcohol  history of stroke  kidney disease  liver disease  muscle aches or weakness  thyroid disease  an unusual or allergic reaction to atorvastatin, other medicines, foods, dyes, or preservatives  pregnant or trying to get pregnant  breast-feeding How should I use this medicine? Take this medicine by mouth with a glass of water. Follow the directions on the prescription label. You can take it with or without food. If it upsets your stomach, take it with food. Do not take with grapefruit juice. Take  your medicine at regular intervals. Do not take it more often than directed. Do not stop taking except on your doctor's advice. Talk to your pediatrician regarding the use of this medicine in children. While this drug may be prescribed for children as young as 10 for selected conditions, precautions do apply. Overdosage: If you think you have taken too much of this medicine contact a poison control center or emergency room at once. NOTE: This medicine is only for you. Do not share this medicine with others. What if I miss a dose? If you miss a dose, take it as soon as you can. If your next dose is to be taken in less than 12 hours, then do not take the missed dose. Take the next dose at your regular time. Do not take double or extra doses. What may interact with this medicine? Do not take this medicine with any of the following medications:  dasabuvir; ombitasvir; paritaprevir; ritonavir  ombitasvir; paritaprevir; ritonavir  posaconazole  red yeast rice This medicine may also interact with the following medications:  alcohol  birth control  pills  certain antibiotics like erythromycin and clarithromycin  certain antivirals for HIV or hepatitis  certain medicines for cholesterol like fenofibrate, gemfibrozil, and niacin  certain medicines for fungal infections like ketoconazole and itraconazole  colchicine  cyclosporine  digoxin  grapefruit juice  rifampin This list may not describe all possible interactions. Give your health care provider a list of all the medicines, herbs, non-prescription drugs, or dietary supplements you use. Also tell them if you smoke, drink alcohol, or use illegal drugs. Some items may interact with your medicine. What should I watch for while using this medicine? Visit your doctor or health care professional for regular check-ups. You may need regular tests to make sure your liver is working properly. Your health care professional may tell you to stop taking this medicine if you develop muscle problems. If your muscle problems do not go away after stopping this medicine, contact your health care professional. Do not become pregnant while taking this medicine. Women should inform their health care professional if they wish to become pregnant or think they might be pregnant. There is a potential for serious side effects to an unborn child. Talk to your health care professional or pharmacist for more information. Do not breast-feed an infant while taking this medicine. This medicine may increase blood sugar. Ask your healthcare provider if changes in diet or medicines are needed if you have diabetes. If you are going to need surgery or other procedure, tell your doctor that you are using this medicine. This drug is only part of a total heart-health program. Your doctor or a dietician can suggest a low-cholesterol and low-fat diet to help. Avoid alcohol and smoking, and keep a proper exercise schedule. This medicine may cause a decrease in Co-Enzyme Q-10. You should make sure that you get enough Co-Enzyme  Q-10 while you are taking this medicine. Discuss the foods you eat and the vitamins you take with your health care professional. What side effects may I notice from receiving this medicine? Side effects that you should report to your doctor or health care professional as soon as possible:  allergic reactions like skin rash, itching or hives, swelling of the face, lips, or tongue  fever  joint pain  loss of memory  redness, blistering, peeling or loosening of the skin, including inside the mouth  signs and symptoms of high blood sugar such as being more thirsty or  hungry or having to urinate more than normal. You may also feel very tired or have blurry vision.  signs and symptoms of liver injury like dark yellow or brown urine; general ill feeling or flu-like symptoms; light-belly pain; unusually weak or tired; yellowing of the eyes or skin  signs and symptoms of muscle injury like dark urine; trouble passing urine or change in the amount of urine; unusually weak or tired; muscle pain or side or back pain Side effects that usually do not require medical attention (report to your doctor or health care professional if they continue or are bothersome):  diarrhea  nausea  stomach pain  trouble sleeping  upset stomach This list may not describe all possible side effects. Call your doctor for medical advice about side effects. You may report side effects to FDA at 1-800-FDA-1088. Where should I keep my medicine? Keep out of the reach of children. Store between 20 and 25 degrees C (68 and 77 degrees F). Throw away any unused medicine after the expiration date. NOTE: This sheet is a summary. It may not cover all possible information. If you have questions about this medicine, talk to your doctor, pharmacist, or health care provider.  2020 Elsevier/Gold Standard (2018-05-21 11:36:16)

## 2020-06-16 LAB — BASIC METABOLIC PANEL
BUN/Creatinine Ratio: 19 (ref 10–24)
BUN: 18 mg/dL (ref 8–27)
CO2: 25 mmol/L (ref 20–29)
Calcium: 8.6 mg/dL (ref 8.6–10.2)
Chloride: 102 mmol/L (ref 96–106)
Creatinine, Ser: 0.95 mg/dL (ref 0.76–1.27)
GFR calc Af Amer: 98 mL/min/{1.73_m2} (ref >59)
GFR calc non Af Amer: 85 mL/min/{1.73_m2} (ref >59)
Glucose: 102 mg/dL — ABNORMAL HIGH (ref 65–99)
Potassium: 3.6 mmol/L (ref 3.5–5.2)
Sodium: 142 mmol/L (ref 134–144)

## 2020-06-22 ENCOUNTER — Other Ambulatory Visit: Payer: BLUE CROSS/BLUE SHIELD

## 2020-06-26 ENCOUNTER — Ambulatory Visit (INDEPENDENT_AMBULATORY_CARE_PROVIDER_SITE_OTHER): Payer: BC Managed Care – PPO

## 2020-06-26 DIAGNOSIS — G459 Transient cerebral ischemic attack, unspecified: Secondary | ICD-10-CM

## 2020-06-26 DIAGNOSIS — I4891 Unspecified atrial fibrillation: Secondary | ICD-10-CM

## 2020-06-26 DIAGNOSIS — R200 Anesthesia of skin: Secondary | ICD-10-CM

## 2020-06-29 ENCOUNTER — Ambulatory Visit
Admission: RE | Admit: 2020-06-29 | Discharge: 2020-06-29 | Disposition: A | Discharge status: Home / Self Care | Payer: BLUE CROSS/BLUE SHIELD | Source: Ambulatory Visit | Attending: Neurology | Admitting: Neurology

## 2020-06-29 DIAGNOSIS — I771 Stricture of artery: Secondary | ICD-10-CM | POA: Diagnosis not present

## 2020-06-29 DIAGNOSIS — J3489 Other specified disorders of nose and nasal sinuses: Secondary | ICD-10-CM | POA: Diagnosis not present

## 2020-06-29 DIAGNOSIS — R200 Anesthesia of skin: Secondary | ICD-10-CM

## 2020-06-29 DIAGNOSIS — I700 Atherosclerosis of aorta: Secondary | ICD-10-CM | POA: Diagnosis not present

## 2020-06-29 DIAGNOSIS — I672 Cerebral atherosclerosis: Secondary | ICD-10-CM | POA: Diagnosis not present

## 2020-06-29 DIAGNOSIS — G9389 Other specified disorders of brain: Secondary | ICD-10-CM | POA: Diagnosis not present

## 2020-06-29 MED ORDER — IOPAMIDOL (ISOVUE-370) INJECTION 76%
75.0000 mL | Freq: Once | INTRAVENOUS | Status: AC | PRN
  Administered 2020-06-29: 75 mL via INTRAVENOUS

## 2020-07-13 ENCOUNTER — Other Ambulatory Visit: Payer: Self-pay | Admitting: Neurology

## 2020-07-13 DIAGNOSIS — I4891 Unspecified atrial fibrillation: Secondary | ICD-10-CM

## 2020-07-13 DIAGNOSIS — R200 Anesthesia of skin: Secondary | ICD-10-CM

## 2020-07-13 DIAGNOSIS — G459 Transient cerebral ischemic attack, unspecified: Secondary | ICD-10-CM

## 2020-07-27 ENCOUNTER — Telehealth: Payer: Self-pay | Admitting: Neurology

## 2020-07-27 NOTE — Telephone Encounter (Signed)
Spoke with Dr Jaynee Eagles. No proof of TIA or stroke. Looks like echo is still pending.

## 2020-07-27 NOTE — Telephone Encounter (Signed)
TIA is what I am doing evaluations for. thanks

## 2020-07-27 NOTE — Telephone Encounter (Signed)
TIA is being ruled out in Dr Cathren Laine work-up per office note.

## 2020-07-27 NOTE — Telephone Encounter (Signed)
I spoke with both the pt and his wife. They would like documentation that the patient has not had a TIA or stroke. He is trying to apply for insurance. I also d/w them that the echo is still pending. They were unaware. I told the pt/wife I would f/u with outgoing referrals. He verbalized appreciation.

## 2020-07-27 NOTE — Telephone Encounter (Signed)
Pt's wife, Kahil Agner (on [place]) called, MyChart says he had a TIA. But was told everything was fine. Does he have a TIA, if so what is the next step? Would like a call from the nurse.

## 2020-07-27 NOTE — Telephone Encounter (Signed)
I will have to wait until all the test results are completed and then I will write a letter for them explaining his symptoms, what we were evaluating for, and the results of the tests. It may take 7-10 business days after we get the results back especially since the holidays are coming up and I will be out of the office. thanks

## 2020-07-28 NOTE — Telephone Encounter (Signed)
I advised the patient of Dr Cathren Laine message and he was very Patent attorney.

## 2020-08-01 NOTE — Telephone Encounter (Signed)
I have reached out to [place] at [facility] for scheduling to sch. Echo . Thanks Hinton Dyer

## 2020-08-09 ENCOUNTER — Ambulatory Visit (HOSPITAL_COMMUNITY)
Admission: RE | Admit: 2020-08-09 | Discharge: 2020-08-09 | Disposition: A | Discharge status: Home / Self Care | Payer: BC Managed Care – PPO | Source: Ambulatory Visit | Attending: Neurology | Admitting: Neurology

## 2020-08-09 ENCOUNTER — Other Ambulatory Visit: Payer: Self-pay

## 2020-08-09 DIAGNOSIS — I10 Essential (primary) hypertension: Secondary | ICD-10-CM | POA: Insufficient documentation

## 2020-08-09 DIAGNOSIS — I361 Nonrheumatic tricuspid (valve) insufficiency: Secondary | ICD-10-CM | POA: Diagnosis not present

## 2020-08-09 DIAGNOSIS — I081 Rheumatic disorders of both mitral and tricuspid valves: Secondary | ICD-10-CM | POA: Insufficient documentation

## 2020-08-09 DIAGNOSIS — R200 Anesthesia of skin: Secondary | ICD-10-CM | POA: Diagnosis not present

## 2020-08-09 DIAGNOSIS — G459 Transient cerebral ischemic attack, unspecified: Secondary | ICD-10-CM

## 2020-08-09 LAB — ECHOCARDIOGRAM COMPLETE BUBBLE STUDY
Area-P 1/2: 3.34 cm2
Calc EF: 58.3 %
S' Lateral: 3.8 cm
Single Plane A2C EF: 63 %
Single Plane A4C EF: 53.5 %

## 2020-08-09 MED ORDER — PERFLUTREN LIPID MICROSPHERE
1.0000 mL | INTRAVENOUS | Status: AC | PRN
  Administered 2020-08-09: 2 mL via INTRAVENOUS
  Filled 2020-08-09: qty 10

## 2020-08-09 MED ORDER — SODIUM CHLORIDE 0.9% FLUSH
10.0000 mL | Freq: Once | INTRAVENOUS | Status: AC
  Administered 2020-08-09: 10 mL via INTRAVENOUS

## 2020-08-09 NOTE — Progress Notes (Signed)
  Echocardiogram 2D Echocardiogram with definity and bubble study has been performed.  Leta Jungling M 08/09/2020, 11:07 AM

## 2020-08-22 ENCOUNTER — Encounter: Payer: Self-pay | Admitting: *Deleted

## 2020-08-22 NOTE — Telephone Encounter (Signed)
Dr Jaynee Eagles signed the letter and took it to medical records to have mailed to pt.

## 2020-08-22 NOTE — Telephone Encounter (Signed)
Letter written and is pending MD signature.

## 2020-08-31 DIAGNOSIS — Z1152 Encounter for screening for COVID-19: Secondary | ICD-10-CM | POA: Diagnosis not present

## 2020-09-12 DIAGNOSIS — L820 Inflamed seborrheic keratosis: Secondary | ICD-10-CM | POA: Diagnosis not present

## 2020-09-12 DIAGNOSIS — Z85828 Personal history of other malignant neoplasm of skin: Secondary | ICD-10-CM | POA: Diagnosis not present

## 2020-09-12 DIAGNOSIS — D1801 Hemangioma of skin and subcutaneous tissue: Secondary | ICD-10-CM | POA: Diagnosis not present

## 2020-09-12 DIAGNOSIS — L821 Other seborrheic keratosis: Secondary | ICD-10-CM | POA: Diagnosis not present

## 2020-09-12 DIAGNOSIS — L570 Actinic keratosis: Secondary | ICD-10-CM | POA: Diagnosis not present

## 2020-09-12 DIAGNOSIS — L812 Freckles: Secondary | ICD-10-CM | POA: Diagnosis not present

## 2020-10-27 DIAGNOSIS — Z0000 Encounter for general adult medical examination without abnormal findings: Secondary | ICD-10-CM | POA: Diagnosis not present

## 2020-10-27 DIAGNOSIS — Z23 Encounter for immunization: Secondary | ICD-10-CM | POA: Diagnosis not present

## 2020-10-27 DIAGNOSIS — Z125 Encounter for screening for malignant neoplasm of prostate: Secondary | ICD-10-CM | POA: Diagnosis not present

## 2020-10-27 DIAGNOSIS — Z13220 Encounter for screening for lipoid disorders: Secondary | ICD-10-CM | POA: Diagnosis not present

## 2020-10-27 DIAGNOSIS — I10 Essential (primary) hypertension: Secondary | ICD-10-CM | POA: Diagnosis not present

## 2020-11-16 DIAGNOSIS — Z961 Presence of intraocular lens: Secondary | ICD-10-CM | POA: Diagnosis not present

## 2020-11-16 DIAGNOSIS — H40013 Open angle with borderline findings, low risk, bilateral: Secondary | ICD-10-CM | POA: Diagnosis not present

## 2020-11-16 DIAGNOSIS — H524 Presbyopia: Secondary | ICD-10-CM | POA: Diagnosis not present

## 2020-11-24 ENCOUNTER — Ambulatory Visit (INDEPENDENT_AMBULATORY_CARE_PROVIDER_SITE_OTHER): Payer: BC Managed Care – PPO | Admitting: Podiatry

## 2020-11-24 ENCOUNTER — Other Ambulatory Visit: Payer: Self-pay

## 2020-11-24 ENCOUNTER — Ambulatory Visit (INDEPENDENT_AMBULATORY_CARE_PROVIDER_SITE_OTHER): Payer: BC Managed Care – PPO

## 2020-11-24 DIAGNOSIS — M779 Enthesopathy, unspecified: Secondary | ICD-10-CM

## 2020-11-24 DIAGNOSIS — I10 Essential (primary) hypertension: Secondary | ICD-10-CM | POA: Insufficient documentation

## 2020-11-24 DIAGNOSIS — M79671 Pain in right foot: Secondary | ICD-10-CM

## 2020-11-24 MED ORDER — TRIAMCINOLONE ACETONIDE 10 MG/ML IJ SUSP
10.0000 mg | Freq: Once | INTRAMUSCULAR | Status: AC
Start: 2020-11-24 — End: 2020-11-24
  Administered 2020-11-24: 10 mg

## 2020-11-24 MED ORDER — METHYLPREDNISOLONE 4 MG PO TBPK: ORAL_TABLET | ORAL | 0 refills | Status: AC

## 2020-11-27 NOTE — Progress Notes (Signed)
Subjective:   Patient ID: Brian Sherman, male   DOB: 65 y.o.   MRN: [id number]   HPI Patient states he developed pain around his big toe joint right and its been inflamed and making it hard for him to walk   ROS      Objective:  Physical Exam  Neurovascular status intact with inflammation pain around the first MPJ right with fluid buildup noted around the joint surface     Assessment:  Inflammatory capsulitis first MPJ right with strong possibility for gout     Plan:  H&P x-ray reviewed and I did sterile prep and injected around the first MPJ 3 mg Dexasone Kenalog 5 mg Xylocaine and placed on six 6-day steroid pack.  Reappoint to recheck as symptoms indicate  X-rays indicate there is moderate structural bunion deformity no indications of osteolysis or arthritis of the joint surface

## 2020-11-29 ENCOUNTER — Other Ambulatory Visit: Payer: Self-pay | Admitting: Podiatry

## 2020-11-29 DIAGNOSIS — M779 Enthesopathy, unspecified: Secondary | ICD-10-CM

## 2021-02-10 DIAGNOSIS — Z23 Encounter for immunization: Secondary | ICD-10-CM | POA: Diagnosis not present

## 2021-03-28 DIAGNOSIS — Z85828 Personal history of other malignant neoplasm of skin: Secondary | ICD-10-CM | POA: Diagnosis not present

## 2021-03-28 DIAGNOSIS — L821 Other seborrheic keratosis: Secondary | ICD-10-CM | POA: Diagnosis not present

## 2021-03-28 DIAGNOSIS — L812 Freckles: Secondary | ICD-10-CM | POA: Diagnosis not present

## 2021-03-28 DIAGNOSIS — L570 Actinic keratosis: Secondary | ICD-10-CM | POA: Diagnosis not present

## 2021-03-28 DIAGNOSIS — D225 Melanocytic nevi of trunk: Secondary | ICD-10-CM | POA: Diagnosis not present

## 2021-06-23 DIAGNOSIS — M25562 Pain in left knee: Secondary | ICD-10-CM | POA: Diagnosis not present

## 2021-07-03 DIAGNOSIS — M25562 Pain in left knee: Secondary | ICD-10-CM | POA: Diagnosis not present

## 2021-08-08 DIAGNOSIS — M79671 Pain in right foot: Secondary | ICD-10-CM | POA: Diagnosis not present

## 2021-08-16 ENCOUNTER — Ambulatory Visit: Payer: BC Managed Care – PPO | Admitting: Podiatry

## 2021-09-12 ENCOUNTER — Other Ambulatory Visit: Payer: Self-pay | Admitting: Neurology

## 2021-09-27 DIAGNOSIS — L821 Other seborrheic keratosis: Secondary | ICD-10-CM | POA: Diagnosis not present

## 2021-09-27 DIAGNOSIS — D1721 Benign lipomatous neoplasm of skin and subcutaneous tissue of right arm: Secondary | ICD-10-CM | POA: Diagnosis not present

## 2021-09-27 DIAGNOSIS — L570 Actinic keratosis: Secondary | ICD-10-CM | POA: Diagnosis not present

## 2021-09-27 DIAGNOSIS — Z85828 Personal history of other malignant neoplasm of skin: Secondary | ICD-10-CM | POA: Diagnosis not present

## 2021-12-27 DIAGNOSIS — Z0000 Encounter for general adult medical examination without abnormal findings: Secondary | ICD-10-CM | POA: Diagnosis not present

## 2021-12-27 DIAGNOSIS — R001 Bradycardia, unspecified: Secondary | ICD-10-CM | POA: Diagnosis not present

## 2021-12-27 DIAGNOSIS — E7800 Pure hypercholesterolemia, unspecified: Secondary | ICD-10-CM | POA: Diagnosis not present

## 2021-12-27 DIAGNOSIS — E876 Hypokalemia: Secondary | ICD-10-CM | POA: Diagnosis not present

## 2021-12-27 DIAGNOSIS — I10 Essential (primary) hypertension: Secondary | ICD-10-CM | POA: Diagnosis not present

## 2021-12-27 DIAGNOSIS — D692 Other nonthrombocytopenic purpura: Secondary | ICD-10-CM | POA: Diagnosis not present

## 2021-12-27 DIAGNOSIS — Z23 Encounter for immunization: Secondary | ICD-10-CM | POA: Diagnosis not present

## 2022-01-03 ENCOUNTER — Telehealth: Payer: Self-pay | Admitting: *Deleted

## 2022-01-03 ENCOUNTER — Ambulatory Visit: Payer: Medicare HMO | Admitting: Podiatry

## 2022-01-03 ENCOUNTER — Ambulatory Visit (INDEPENDENT_AMBULATORY_CARE_PROVIDER_SITE_OTHER): Payer: Medicare HMO

## 2022-01-03 DIAGNOSIS — M7751 Other enthesopathy of right foot: Secondary | ICD-10-CM | POA: Diagnosis not present

## 2022-01-03 DIAGNOSIS — M109 Gout, unspecified: Secondary | ICD-10-CM

## 2022-01-03 NOTE — Telephone Encounter (Signed)
Brian Sherman w/ [facility]([phone]) is calling for clarification on whether the CRP is cardiac or quantitative? Please advise.

## 2022-01-03 NOTE — Progress Notes (Signed)
Subjective:   Patient ID: Brian Sherman, male   DOB: 66 y.o.   MRN: [id number]   HPI Patient presents stating he has developed a lot of pain in his right ankle stating its been sore concerned about thickness of his left big toenail and has had history of different swellings occurring of his right foot over the last few months not for the last few weeks   ROS      Objective:  Physical Exam  Neurovascular status found to be intact moderate edema of the right lateral foot into the ankle exquisite discomfort sinus tarsi right and thick hallux nail left     Assessment:  Numerous different problems with inflammatory capsulitis of the sinus tarsi right along with swelling right which may be related to gout with what sounds like history of gout and nail disease left big toe     Plan:  H&P all conditions reviewed and today I did sterile prep and I injected the capsule of the sinus tarsi right 3 mg Kenalog 5 mg Xylocaine and I applied compression stocking to help with the swelling the patient's experiencing.  I advised on elevation and we will get blood work and try to ascertain gout and I reviewed with him gout and possibilities for medication if we find high uric acid level.  Patient will be seen back as needed  X-rays were negative for signs of fracture did not indicate bony injury appears to be soft tissue inflammatory condition

## 2022-01-05 LAB — RHEUMATOID ARTHRITIS PROFILE
Cyclic Citrullin Peptide Ab: 5 units (ref 0–19)
Rheumatoid fact SerPl-aCnc: <10 IU/mL (ref <14.0)

## 2022-01-11 DIAGNOSIS — R7301 Impaired fasting glucose: Secondary | ICD-10-CM | POA: Diagnosis not present

## 2022-01-11 DIAGNOSIS — Z131 Encounter for screening for diabetes mellitus: Secondary | ICD-10-CM | POA: Diagnosis not present

## 2022-01-16 DIAGNOSIS — H40013 Open angle with borderline findings, low risk, bilateral: Secondary | ICD-10-CM | POA: Diagnosis not present

## 2022-01-16 DIAGNOSIS — H524 Presbyopia: Secondary | ICD-10-CM | POA: Diagnosis not present

## 2022-01-16 DIAGNOSIS — Z961 Presence of intraocular lens: Secondary | ICD-10-CM | POA: Diagnosis not present

## 2022-01-19 ENCOUNTER — Other Ambulatory Visit: Payer: Self-pay | Admitting: Podiatry

## 2022-01-19 DIAGNOSIS — M109 Gout, unspecified: Secondary | ICD-10-CM

## 2022-01-23 DIAGNOSIS — M109 Gout, unspecified: Secondary | ICD-10-CM | POA: Diagnosis not present

## 2022-01-24 LAB — COMPREHENSIVE METABOLIC PANEL
AG Ratio: 1.6 (calc) (ref 1.0–2.5)
ALT: 27 U/L (ref 9–46)
AST: 22 U/L (ref 10–35)
Albumin: 4.2 g/dL (ref 3.6–5.1)
Alkaline phosphatase (APISO): 60 U/L (ref 35–144)
BUN/Creatinine Ratio: 20 (calc) (ref 6–22)
BUN: 26 mg/dL — ABNORMAL HIGH (ref 7–25)
CO2: 28 mmol/L (ref 20–32)
Calcium: 9.6 mg/dL (ref 8.6–10.3)
Chloride: 107 mmol/L (ref 98–110)
Creat: 1.27 mg/dL (ref 0.70–1.35)
Globulin: 2.6 g/dL (calc) (ref 1.9–3.7)
Glucose, Bld: 84 mg/dL (ref 65–139)
Potassium: 3.8 mmol/L (ref 3.5–5.3)
Sodium: 144 mmol/L (ref 135–146)
Total Bilirubin: 0.7 mg/dL (ref 0.2–1.2)
Total Protein: 6.8 g/dL (ref 6.1–8.1)

## 2022-01-24 LAB — CBC WITH DIFFERENTIAL/PLATELET
Absolute Monocytes: 554 cells/uL (ref 200–950)
Basophils Absolute: 50 cells/uL (ref 0–200)
Basophils Relative: 0.7 %
Eosinophils Absolute: 187 cells/uL (ref 15–500)
Eosinophils Relative: 2.6 %
HCT: 49.2 % (ref 38.5–50.0)
Hemoglobin: 16.8 g/dL (ref 13.2–17.1)
Lymphs Abs: 1951 cells/uL (ref 850–3900)
MCH: 31.5 pg (ref 27.0–33.0)
MCHC: 34.1 g/dL (ref 32.0–36.0)
MCV: 92.3 fL (ref 80.0–100.0)
MPV: 10 fL (ref 7.5–12.5)
Monocytes Relative: 7.7 %
Neutro Abs: 4457 cells/uL (ref 1500–7800)
Neutrophils Relative %: 61.9 %
Platelets: 176 10*3/uL (ref 140–400)
RBC: 5.33 10*6/uL (ref 4.20–5.80)
RDW: 13.7 % (ref 11.0–15.0)
Total Lymphocyte: 27.1 %
WBC: 7.2 10*3/uL (ref 3.8–10.8)

## 2022-01-24 LAB — C-REACTIVE PROTEIN: CRP: 2.6 mg/L (ref <8.0)

## 2022-01-24 LAB — URIC ACID: Uric Acid, Serum: 8.6 mg/dL — ABNORMAL HIGH (ref 4.0–8.0)

## 2022-01-24 LAB — ANA, IFA COMPREHENSIVE PANEL
Anti Nuclear Antibody (ANA): NEGATIVE
ENA SM Ab Ser-aCnc: <1 AI
SM/RNP: <1 AI
SSA (Ro) (ENA) Antibody, IgG: <1 AI
SSB (La) (ENA) Antibody, IgG: <1 AI
Scleroderma (Scl-70) (ENA) Antibody, IgG: <1 AI
ds DNA Ab: <1 IU/mL

## 2022-01-24 LAB — SEDIMENTATION RATE: Sed Rate: 2 mm/h (ref 0–20)

## 2022-01-25 MED ORDER — ALLOPURINOL 100 MG PO TABS: 100.0000 mg | ORAL_TABLET | Freq: Every day | ORAL | 6 refills | Status: DC

## 2022-01-25 NOTE — Addendum Note (Signed)
Addended by: Wallene Huh on: 01/25/2022 09:28 AM   Modules accepted: Orders

## 2022-04-18 DIAGNOSIS — L821 Other seborrheic keratosis: Secondary | ICD-10-CM | POA: Diagnosis not present

## 2022-04-18 DIAGNOSIS — L570 Actinic keratosis: Secondary | ICD-10-CM | POA: Diagnosis not present

## 2022-04-18 DIAGNOSIS — L812 Freckles: Secondary | ICD-10-CM | POA: Diagnosis not present

## 2022-04-18 DIAGNOSIS — D225 Melanocytic nevi of trunk: Secondary | ICD-10-CM | POA: Diagnosis not present

## 2022-04-18 DIAGNOSIS — Z85828 Personal history of other malignant neoplasm of skin: Secondary | ICD-10-CM | POA: Diagnosis not present

## 2022-07-27 DIAGNOSIS — M25561 Pain in right knee: Secondary | ICD-10-CM | POA: Diagnosis not present

## 2022-09-18 ENCOUNTER — Other Ambulatory Visit: Payer: Self-pay | Admitting: Podiatry

## 2023-02-11 ENCOUNTER — Other Ambulatory Visit: Payer: Self-pay | Admitting: Family Medicine

## 2023-02-11 ENCOUNTER — Encounter: Payer: Self-pay | Admitting: Family Medicine

## 2023-02-11 ENCOUNTER — Ambulatory Visit: Payer: Medicare Other

## 2023-02-11 DIAGNOSIS — M5410 Radiculopathy, site unspecified: Secondary | ICD-10-CM

## 2023-02-11 DIAGNOSIS — M5416 Radiculopathy, lumbar region: Secondary | ICD-10-CM

## 2023-02-12 ENCOUNTER — Other Ambulatory Visit: Payer: Self-pay | Admitting: Podiatry

## 2023-02-12 MED ORDER — CYCLOBENZAPRINE HCL 10 MG PO TABS: 10.0000 mg | ORAL_TABLET | Freq: Three times a day (TID) | ORAL | 0 refills | Status: AC | PRN

## 2023-02-22 ENCOUNTER — Other Ambulatory Visit: Payer: Self-pay | Admitting: Podiatry

## 2023-02-22 MED ORDER — TRAMADOL HCL 50 MG PO TABS: 50.0000 mg | ORAL_TABLET | Freq: Three times a day (TID) | ORAL | 0 refills | Status: AC | PRN

## 2023-02-26 ENCOUNTER — Other Ambulatory Visit: Payer: Self-pay | Admitting: Podiatry

## 2023-02-26 MED ORDER — TRAMADOL HCL 50 MG PO TABS: 50.0000 mg | ORAL_TABLET | Freq: Four times a day (QID) | ORAL | 0 refills | Status: AC | PRN

## 2023-03-27 ENCOUNTER — Other Ambulatory Visit: Payer: Self-pay | Admitting: Podiatry

## 2023-03-27 MED ORDER — TRAMADOL HCL 50 MG PO TABS: 50.0000 mg | ORAL_TABLET | Freq: Four times a day (QID) | ORAL | 0 refills | Status: AC | PRN

## 2023-04-17 ENCOUNTER — Other Ambulatory Visit: Payer: Self-pay | Admitting: Podiatry

## 2023-08-05 ENCOUNTER — Other Ambulatory Visit (HOSPITAL_COMMUNITY): Payer: Self-pay | Admitting: Family Medicine

## 2023-08-05 DIAGNOSIS — Z8249 Family history of ischemic heart disease and other diseases of the circulatory system: Secondary | ICD-10-CM

## 2023-08-05 DIAGNOSIS — E7800 Pure hypercholesterolemia, unspecified: Secondary | ICD-10-CM

## 2023-08-19 ENCOUNTER — Ambulatory Visit (HOSPITAL_COMMUNITY)
Admission: RE | Admit: 2023-08-19 | Discharge: 2023-08-19 | Disposition: A | Discharge status: Home / Self Care | Payer: Self-pay | Source: Ambulatory Visit | Attending: Family Medicine | Admitting: Family Medicine

## 2023-08-19 DIAGNOSIS — E7800 Pure hypercholesterolemia, unspecified: Secondary | ICD-10-CM | POA: Insufficient documentation

## 2023-08-19 DIAGNOSIS — Z8249 Family history of ischemic heart disease and other diseases of the circulatory system: Secondary | ICD-10-CM | POA: Insufficient documentation
